# Patient Record
Sex: Male | Born: 1975 | Race: White | Hispanic: No | Marital: Married | Smoking: Current every day smoker
Health system: Southern US, Community
[De-identification: ages and names within clinical notes are randomized; demographics above are authoritative.]

## PROBLEM LIST (undated history)

## (undated) DIAGNOSIS — Z789 Other specified health status: Secondary | ICD-10-CM

## (undated) HISTORY — PX: KNEE ARTHROSCOPY: SHX127

---

## 1998-10-25 ENCOUNTER — Emergency Department (HOSPITAL_COMMUNITY): Admission: EM | Admit: 1998-10-25 | Discharge: 1998-10-25 | Payer: Self-pay | Admitting: Emergency Medicine

## 1999-11-05 ENCOUNTER — Encounter: Payer: Self-pay | Admitting: Emergency Medicine

## 1999-11-05 ENCOUNTER — Emergency Department (HOSPITAL_COMMUNITY): Admission: EM | Admit: 1999-11-05 | Discharge: 1999-11-05 | Payer: Self-pay | Admitting: Emergency Medicine

## 2000-01-25 ENCOUNTER — Emergency Department (HOSPITAL_COMMUNITY): Admission: EM | Admit: 2000-01-25 | Discharge: 2000-01-26 | Payer: Self-pay | Admitting: Emergency Medicine

## 2000-01-25 ENCOUNTER — Encounter: Payer: Self-pay | Admitting: Emergency Medicine

## 2001-05-24 ENCOUNTER — Emergency Department (HOSPITAL_COMMUNITY): Admission: EM | Admit: 2001-05-24 | Discharge: 2001-05-24 | Payer: Self-pay | Admitting: Emergency Medicine

## 2002-03-25 ENCOUNTER — Encounter: Payer: Self-pay | Admitting: Emergency Medicine

## 2002-03-25 ENCOUNTER — Emergency Department (HOSPITAL_COMMUNITY): Admission: EM | Admit: 2002-03-25 | Discharge: 2002-03-25 | Payer: Self-pay | Admitting: Emergency Medicine

## 2003-06-13 ENCOUNTER — Emergency Department (HOSPITAL_COMMUNITY): Admission: AD | Admit: 2003-06-13 | Discharge: 2003-06-13 | Payer: Self-pay | Admitting: *Deleted

## 2005-10-23 ENCOUNTER — Emergency Department (HOSPITAL_COMMUNITY): Admission: EM | Admit: 2005-10-23 | Discharge: 2005-10-23 | Payer: Self-pay | Admitting: Family Medicine

## 2006-04-11 ENCOUNTER — Emergency Department (HOSPITAL_COMMUNITY): Admission: EM | Admit: 2006-04-11 | Discharge: 2006-04-11 | Payer: Self-pay | Admitting: Emergency Medicine

## 2015-05-20 ENCOUNTER — Encounter (HOSPITAL_COMMUNITY)
Admission: EM | Disposition: A | Discharge status: Home / Self Care | Payer: Self-pay | Source: Home / Self Care | Attending: Emergency Medicine

## 2015-05-20 ENCOUNTER — Emergency Department (HOSPITAL_COMMUNITY): Payer: 59

## 2015-05-20 ENCOUNTER — Observation Stay (HOSPITAL_COMMUNITY)
Admission: EM | Admit: 2015-05-20 | Discharge: 2015-05-21 | Disposition: A | Discharge status: Home / Self Care | Payer: 59 | Attending: General Surgery | Admitting: General Surgery

## 2015-05-20 ENCOUNTER — Encounter (HOSPITAL_COMMUNITY): Payer: Self-pay | Admitting: Emergency Medicine

## 2015-05-20 ENCOUNTER — Emergency Department (HOSPITAL_COMMUNITY): Payer: 59 | Admitting: Certified Registered Nurse Anesthetist

## 2015-05-20 DIAGNOSIS — K3580 Unspecified acute appendicitis: Secondary | ICD-10-CM | POA: Diagnosis not present

## 2015-05-20 DIAGNOSIS — R1031 Right lower quadrant pain: Secondary | ICD-10-CM | POA: Diagnosis present

## 2015-05-20 DIAGNOSIS — F17210 Nicotine dependence, cigarettes, uncomplicated: Secondary | ICD-10-CM | POA: Insufficient documentation

## 2015-05-20 DIAGNOSIS — K353 Acute appendicitis with localized peritonitis, without perforation or gangrene: Secondary | ICD-10-CM

## 2015-05-20 HISTORY — DX: Other specified health status: Z78.9

## 2015-05-20 HISTORY — PX: LAPAROSCOPIC APPENDECTOMY: SHX408

## 2015-05-20 HISTORY — PX: APPENDECTOMY: SHX54

## 2015-05-20 LAB — CBC
HEMATOCRIT: 47.1 % (ref 39.0–52.0)
Hemoglobin: 16.1 g/dL (ref 13.0–17.0)
MCH: 32.3 pg (ref 26.0–34.0)
MCHC: 34.2 g/dL (ref 30.0–36.0)
MCV: 94.4 fL (ref 78.0–100.0)
Platelets: 206 10*3/uL (ref 150–400)
RBC: 4.99 MIL/uL (ref 4.22–5.81)
RDW: 13.3 % (ref 11.5–15.5)
WBC: 16.5 10*3/uL — AB (ref 4.0–10.5)

## 2015-05-20 LAB — URINALYSIS, ROUTINE W REFLEX MICROSCOPIC
Bilirubin Urine: NEGATIVE
GLUCOSE, UA: NEGATIVE mg/dL
HGB URINE DIPSTICK: NEGATIVE
KETONES UR: NEGATIVE mg/dL
Nitrite: NEGATIVE
PH: 7 (ref 5.0–8.0)
Protein, ur: NEGATIVE mg/dL
Specific Gravity, Urine: 1.024 (ref 1.005–1.030)

## 2015-05-20 LAB — COMPREHENSIVE METABOLIC PANEL
ALBUMIN: 3.9 g/dL (ref 3.5–5.0)
ALT: 24 U/L (ref 17–63)
AST: 23 U/L (ref 15–41)
Alkaline Phosphatase: 66 U/L (ref 38–126)
Anion gap: 9 (ref 5–15)
BUN: 7 mg/dL (ref 6–20)
CHLORIDE: 104 mmol/L (ref 101–111)
CO2: 26 mmol/L (ref 22–32)
Calcium: 9.1 mg/dL (ref 8.9–10.3)
Creatinine, Ser: 0.97 mg/dL (ref 0.61–1.24)
GFR calc Af Amer: >60 mL/min (ref >60)
GLUCOSE: 140 mg/dL — AB (ref 65–99)
POTASSIUM: 3.9 mmol/L (ref 3.5–5.1)
SODIUM: 139 mmol/L (ref 135–145)
Total Bilirubin: 0.7 mg/dL (ref 0.3–1.2)
Total Protein: 7.3 g/dL (ref 6.5–8.1)

## 2015-05-20 LAB — URINE MICROSCOPIC-ADD ON

## 2015-05-20 LAB — LIPASE, BLOOD: LIPASE: 22 U/L (ref 11–51)

## 2015-05-20 SURGERY — APPENDECTOMY, LAPAROSCOPIC
Anesthesia: General

## 2015-05-20 MED ORDER — ONDANSETRON HCL 4 MG/2ML IJ SOLN
INTRAMUSCULAR | Status: DC | PRN
  Administered 2015-05-20: 4 mg via INTRAVENOUS

## 2015-05-20 MED ORDER — IOHEXOL 300 MG/ML  SOLN
100.0000 mL | Freq: Once | INTRAMUSCULAR | Status: AC | PRN
  Administered 2015-05-20: 100 mL via INTRAVENOUS

## 2015-05-20 MED ORDER — HYDROMORPHONE HCL 1 MG/ML IJ SOLN
0.5000 mg | INTRAMUSCULAR | Status: DC | PRN
  Administered 2015-05-20 – 2015-05-21 (×3): 0.5 mg via INTRAVENOUS
  Filled 2015-05-20 (×3): qty 1

## 2015-05-20 MED ORDER — MIDAZOLAM HCL 2 MG/2ML IJ SOLN
INTRAMUSCULAR | Status: AC
  Filled 2015-05-20: qty 2

## 2015-05-20 MED ORDER — DIPHENHYDRAMINE HCL 50 MG/ML IJ SOLN: 12.5000 mg | Freq: Four times a day (QID) | INTRAMUSCULAR | Status: DC | PRN

## 2015-05-20 MED ORDER — FENTANYL CITRATE (PF) 100 MCG/2ML IJ SOLN
INTRAMUSCULAR | Status: AC
  Administered 2015-05-20: 50 ug via INTRAVENOUS
  Filled 2015-05-20: qty 2

## 2015-05-20 MED ORDER — ONDANSETRON HCL 4 MG/2ML IJ SOLN
4.0000 mg | Freq: Once | INTRAMUSCULAR | Status: AC
  Administered 2015-05-20: 4 mg via INTRAVENOUS
  Filled 2015-05-20: qty 2

## 2015-05-20 MED ORDER — DIPHENHYDRAMINE HCL 12.5 MG/5ML PO ELIX: 12.5000 mg | ORAL_SOLUTION | Freq: Four times a day (QID) | ORAL | Status: DC | PRN

## 2015-05-20 MED ORDER — OXYCODONE-ACETAMINOPHEN 5-325 MG PO TABS
ORAL_TABLET | ORAL | Status: AC
  Filled 2015-05-20: qty 2

## 2015-05-20 MED ORDER — BUPIVACAINE-EPINEPHRINE (PF) 0.25% -1:200000 IJ SOLN
INTRAMUSCULAR | Status: AC
  Filled 2015-05-20: qty 30

## 2015-05-20 MED ORDER — LACTATED RINGERS IV SOLN
INTRAVENOUS | Status: DC | PRN
  Administered 2015-05-20 (×2): via INTRAVENOUS

## 2015-05-20 MED ORDER — 0.9 % SODIUM CHLORIDE (POUR BTL) OPTIME
TOPICAL | Status: DC | PRN
Start: 2015-05-20 — End: 2015-05-20
  Administered 2015-05-20: 1000 mL

## 2015-05-20 MED ORDER — ROCURONIUM BROMIDE 100 MG/10ML IV SOLN
INTRAVENOUS | Status: DC | PRN
  Administered 2015-05-20: 50 mg via INTRAVENOUS

## 2015-05-20 MED ORDER — PROPOFOL 10 MG/ML IV BOLUS
INTRAVENOUS | Status: AC
  Filled 2015-05-20: qty 40

## 2015-05-20 MED ORDER — SODIUM CHLORIDE 0.9 % IR SOLN
Status: DC | PRN
  Administered 2015-05-20: 1000 mL

## 2015-05-20 MED ORDER — BUPIVACAINE-EPINEPHRINE 0.25% -1:200000 IJ SOLN
INTRAMUSCULAR | Status: DC | PRN
  Administered 2015-05-20: 10 mL

## 2015-05-20 MED ORDER — MEPERIDINE HCL 25 MG/ML IJ SOLN: 6.2500 mg | INTRAMUSCULAR | Status: DC | PRN

## 2015-05-20 MED ORDER — FENTANYL CITRATE (PF) 250 MCG/5ML IJ SOLN
INTRAMUSCULAR | Status: AC
  Filled 2015-05-20: qty 5

## 2015-05-20 MED ORDER — ROCURONIUM BROMIDE 50 MG/5ML IV SOLN
INTRAVENOUS | Status: AC
  Filled 2015-05-20: qty 1

## 2015-05-20 MED ORDER — FENTANYL CITRATE (PF) 100 MCG/2ML IJ SOLN
INTRAMUSCULAR | Status: AC
  Filled 2015-05-20: qty 2

## 2015-05-20 MED ORDER — DEXAMETHASONE SODIUM PHOSPHATE 4 MG/ML IJ SOLN
INTRAMUSCULAR | Status: DC | PRN
  Administered 2015-05-20: 4 mg via INTRAVENOUS

## 2015-05-20 MED ORDER — ENOXAPARIN SODIUM 40 MG/0.4ML ~~LOC~~ SOLN
40.0000 mg | SUBCUTANEOUS | Status: DC
  Administered 2015-05-20: 40 mg via SUBCUTANEOUS
  Filled 2015-05-20: qty 0.4

## 2015-05-20 MED ORDER — MIDAZOLAM HCL 5 MG/5ML IJ SOLN
INTRAMUSCULAR | Status: DC | PRN
  Administered 2015-05-20: 2 mg via INTRAVENOUS

## 2015-05-20 MED ORDER — SODIUM CHLORIDE 0.9 % IV BOLUS (SEPSIS)
1000.0000 mL | Freq: Once | INTRAVENOUS | Status: AC
  Administered 2015-05-20: 1000 mL via INTRAVENOUS

## 2015-05-20 MED ORDER — LACTATED RINGERS IV SOLN: INTRAVENOUS | Status: DC

## 2015-05-20 MED ORDER — PROPOFOL 10 MG/ML IV BOLUS
INTRAVENOUS | Status: DC | PRN
  Administered 2015-05-20: 200 mg via INTRAVENOUS

## 2015-05-20 MED ORDER — METRONIDAZOLE IN NACL 5-0.79 MG/ML-% IV SOLN
500.0000 mg | Freq: Three times a day (TID) | INTRAVENOUS | Status: DC
  Administered 2015-05-20 – 2015-05-21 (×3): 500 mg via INTRAVENOUS
  Filled 2015-05-20 (×5): qty 100

## 2015-05-20 MED ORDER — FENTANYL CITRATE (PF) 100 MCG/2ML IJ SOLN
INTRAMUSCULAR | Status: DC | PRN
  Administered 2015-05-20: 100 ug via INTRAVENOUS
  Administered 2015-05-20 (×3): 50 ug via INTRAVENOUS

## 2015-05-20 MED ORDER — ARTIFICIAL TEARS OP OINT
TOPICAL_OINTMENT | OPHTHALMIC | Status: AC
  Filled 2015-05-20: qty 3.5

## 2015-05-20 MED ORDER — SUGAMMADEX SODIUM 200 MG/2ML IV SOLN
INTRAVENOUS | Status: DC | PRN
  Administered 2015-05-20: 200 mg via INTRAVENOUS

## 2015-05-20 MED ORDER — ACETAMINOPHEN 325 MG PO TABS: 650.0000 mg | ORAL_TABLET | Freq: Four times a day (QID) | ORAL | Status: DC | PRN

## 2015-05-20 MED ORDER — ONDANSETRON HCL 4 MG/2ML IJ SOLN
INTRAMUSCULAR | Status: AC
  Filled 2015-05-20: qty 2

## 2015-05-20 MED ORDER — FENTANYL CITRATE (PF) 100 MCG/2ML IJ SOLN
25.0000 ug | INTRAMUSCULAR | Status: DC | PRN
  Administered 2015-05-20 (×3): 50 ug via INTRAVENOUS

## 2015-05-20 MED ORDER — LACTATED RINGERS IV SOLN
INTRAVENOUS | Status: DC
  Administered 2015-05-20: 16:00:00 via INTRAVENOUS

## 2015-05-20 MED ORDER — PROMETHAZINE HCL 25 MG/ML IJ SOLN: 6.2500 mg | INTRAMUSCULAR | Status: DC | PRN

## 2015-05-20 MED ORDER — ONDANSETRON HCL 4 MG/2ML IJ SOLN: 4.0000 mg | Freq: Four times a day (QID) | INTRAMUSCULAR | Status: DC | PRN

## 2015-05-20 MED ORDER — ACETAMINOPHEN 650 MG RE SUPP: 650.0000 mg | Freq: Four times a day (QID) | RECTAL | Status: DC | PRN

## 2015-05-20 MED ORDER — SUGAMMADEX SODIUM 200 MG/2ML IV SOLN
INTRAVENOUS | Status: AC
  Filled 2015-05-20: qty 2

## 2015-05-20 MED ORDER — DEXTROSE 5 % IV SOLN
2.0000 g | INTRAVENOUS | Status: DC
  Administered 2015-05-20: 2 g via INTRAVENOUS
  Filled 2015-05-20 (×2): qty 2

## 2015-05-20 MED ORDER — OXYCODONE-ACETAMINOPHEN 5-325 MG PO TABS
1.0000 | ORAL_TABLET | ORAL | Status: DC | PRN
  Administered 2015-05-20: 2 via ORAL
  Administered 2015-05-21: 1 via ORAL
  Administered 2015-05-21: 2 via ORAL
  Filled 2015-05-20: qty 2
  Filled 2015-05-20: qty 1

## 2015-05-20 MED ORDER — SODIUM CHLORIDE 0.9 % IV SOLN
INTRAVENOUS | Status: DC
  Administered 2015-05-20 – 2015-05-21 (×3): via INTRAVENOUS

## 2015-05-20 SURGICAL SUPPLY — 41 items
APPLIER CLIP ROT 10 11.4 M/L (STAPLE)
CANISTER SUCTION 2500CC (MISCELLANEOUS) ×3 IMPLANT
CHLORAPREP W/TINT 26ML (MISCELLANEOUS) ×3 IMPLANT
CLIP APPLIE ROT 10 11.4 M/L (STAPLE) IMPLANT
CLOSURE WOUND 1/2 X4 (GAUZE/BANDAGES/DRESSINGS) ×1
COVER SURGICAL LIGHT HANDLE (MISCELLANEOUS) ×3 IMPLANT
CUTTER FLEX LINEAR 45M (STAPLE) ×3 IMPLANT
DEVICE TROCAR PUNCTURE CLOSURE (ENDOMECHANICALS) ×3 IMPLANT
DRAPE LAPAROSCOPIC ABDOMINAL (DRAPES) ×3 IMPLANT
ELECT REM PT RETURN 9FT ADLT (ELECTROSURGICAL) ×3
ELECTRODE REM PT RTRN 9FT ADLT (ELECTROSURGICAL) ×1 IMPLANT
GLOVE BIO SURGEON STRL SZ7 (GLOVE) ×6 IMPLANT
GLOVE BIO SURGEON STRL SZ7.5 (GLOVE) ×3 IMPLANT
GLOVE BIOGEL PI IND STRL 7.5 (GLOVE) ×2 IMPLANT
GLOVE BIOGEL PI INDICATOR 7.5 (GLOVE) ×4
GLOVE SURG SS PI 7.0 STRL IVOR (GLOVE) ×3 IMPLANT
GOWN STRL REUS W/ TWL LRG LVL3 (GOWN DISPOSABLE) ×3 IMPLANT
GOWN STRL REUS W/TWL LRG LVL3 (GOWN DISPOSABLE) ×6
KIT BASIN OR (CUSTOM PROCEDURE TRAY) ×3 IMPLANT
KIT ROOM TURNOVER OR (KITS) ×3 IMPLANT
LIQUID BAND (GAUZE/BANDAGES/DRESSINGS) ×3 IMPLANT
NS IRRIG 1000ML POUR BTL (IV SOLUTION) ×3 IMPLANT
PAD ARMBOARD 7.5X6 YLW CONV (MISCELLANEOUS) ×6 IMPLANT
POUCH RETRIEVAL ECOSAC 10 (ENDOMECHANICALS) ×1 IMPLANT
POUCH RETRIEVAL ECOSAC 10MM (ENDOMECHANICALS) ×2
RELOAD STAPLE TA45 3.5 REG BLU (ENDOMECHANICALS) ×3 IMPLANT
SCALPEL HARMONIC ACE (MISCELLANEOUS) ×3 IMPLANT
SCISSORS LAP 5X35 DISP (ENDOMECHANICALS) IMPLANT
SET IRRIG TUBING LAPAROSCOPIC (IRRIGATION / IRRIGATOR) ×3 IMPLANT
SLEEVE ENDOPATH XCEL 5M (ENDOMECHANICALS) ×3 IMPLANT
SPECIMEN JAR SMALL (MISCELLANEOUS) ×3 IMPLANT
STRIP CLOSURE SKIN 1/2X4 (GAUZE/BANDAGES/DRESSINGS) ×2 IMPLANT
SUT MNCRL AB 4-0 PS2 18 (SUTURE) ×3 IMPLANT
SUT VICRYL 0 UR6 27IN ABS (SUTURE) ×3 IMPLANT
TOWEL OR 17X24 6PK STRL BLUE (TOWEL DISPOSABLE) ×3 IMPLANT
TOWEL OR 17X26 10 PK STRL BLUE (TOWEL DISPOSABLE) ×3 IMPLANT
TRAY FOLEY CATH 16FR SILVER (SET/KITS/TRAYS/PACK) ×3 IMPLANT
TRAY LAPAROSCOPIC MC (CUSTOM PROCEDURE TRAY) ×3 IMPLANT
TROCAR XCEL BLUNT TIP 100MML (ENDOMECHANICALS) ×3 IMPLANT
TROCAR XCEL NON-BLD 5MMX100MML (ENDOMECHANICALS) ×3 IMPLANT
TUBING INSUFFLATION (TUBING) ×3 IMPLANT

## 2015-05-20 NOTE — H&P (Signed)
Chief Complaint: RLQ abdominal pain  HPI: Darryl Ortega is 40 year old healthy male who presents with RLQ abdominal pain which started around 0930 while he was working.  Associated with vomiting x1 episode.  Denies fever, chills, sweats, anorexia, diarrhea or constipation.  Aggravated by movement.  No alleviating factors.  Coarse is improved with pain meds.  Work up shows WBC 16.5k, normal electrolytes, renal function and UA. CT of abdomen and pelvis demonstrated acute appendicitis. We have therefore been asked to evaluate.   History reviewed. No pertinent past medical history.  History reviewed. No pertinent past surgical history.  Family History  Problem Relation Age of Onset  . CAD Mother   . Diabetes Mellitus II Mother   . Hypertension Mother   . Hyperlipidemia Mother   . Appendicitis Brother    Social History:  reports that he has been smoking.  He does not have any smokeless tobacco history on file. He reports that he does not drink alcohol or use illicit drugs.  Allergies: No Known Allergies   (Not in a hospital admission)  Results for orders placed or performed during the hospital encounter of 05/20/15 (from the past 48 hour(s))  Lipase, blood     Status: None   Collection Time: 05/20/15 10:57 AM  Result Value Ref Range   Lipase 22 11 - 51 U/L  Comprehensive metabolic panel     Status: Abnormal   Collection Time: 05/20/15 10:57 AM  Result Value Ref Range   Sodium 139 135 - 145 mmol/L   Potassium 3.9 3.5 - 5.1 mmol/L   Chloride 104 101 - 111 mmol/L   CO2 26 22 - 32 mmol/L   Glucose, Bld 140 (H) 65 - 99 mg/dL   BUN 7 6 - 20 mg/dL   Creatinine, Ser 0.97 0.61 - 1.24 mg/dL   Calcium 9.1 8.9 - 10.3 mg/dL   Total Protein 7.3 6.5 - 8.1 g/dL   Albumin 3.9 3.5 - 5.0 g/dL   AST 23 15 - 41 U/L   ALT 24 17 - 63 U/L   Alkaline Phosphatase 66 38 - 126 U/L   Total Bilirubin 0.7 0.3 - 1.2 mg/dL   GFR calc non Af Amer >60 >60 mL/min   GFR calc Af Amer >60 >60 mL/min     Comment: (NOTE) The eGFR has been calculated using the CKD EPI equation. This calculation has not been validated in all clinical situations. eGFR's persistently <60 mL/min signify possible Chronic Kidney Disease.    Anion gap 9 5 - 15  CBC     Status: Abnormal   Collection Time: 05/20/15 10:57 AM  Result Value Ref Range   WBC 16.5 (H) 4.0 - 10.5 K/uL   RBC 4.99 4.22 - 5.81 MIL/uL   Hemoglobin 16.1 13.0 - 17.0 g/dL   HCT 47.1 39.0 - 52.0 %   MCV 94.4 78.0 - 100.0 fL   MCH 32.3 26.0 - 34.0 pg   MCHC 34.2 30.0 - 36.0 g/dL   RDW 13.3 11.5 - 15.5 %   Platelets 206 150 - 400 K/uL  Urinalysis, Routine w reflex microscopic (not at [facility])     Status: Abnormal   Collection Time: 05/20/15 11:16 AM  Result Value Ref Range   Color, Urine YELLOW YELLOW   APPearance CLOUDY (A) CLEAR   Specific Gravity, Urine 1.024 1.005 - 1.030   pH 7.0 5.0 - 8.0   Glucose, UA NEGATIVE NEGATIVE mg/dL   Hgb urine dipstick NEGATIVE NEGATIVE  Bilirubin Urine NEGATIVE NEGATIVE   Ketones, ur NEGATIVE NEGATIVE mg/dL   Protein, ur NEGATIVE NEGATIVE mg/dL   Nitrite NEGATIVE NEGATIVE   Leukocytes, UA TRACE (A) NEGATIVE  Urine microscopic-add on     Status: Abnormal   Collection Time: 05/20/15 11:16 AM  Result Value Ref Range   Squamous Epithelial / LPF 0-5 (A) NONE SEEN   WBC, UA 0-5 0 - 5 WBC/hpf   RBC / HPF 0-5 0 - 5 RBC/hpf   Bacteria, UA RARE (A) NONE SEEN   Urine-Other MUCOUS PRESENT    Ct Abdomen Pelvis W Contrast  05/20/2015  CLINICAL DATA:  Right lower quadrant abdominal pain. Single episode of vomiting. Elevated white blood cell count. EXAM: CT ABDOMEN AND PELVIS WITH CONTRAST TECHNIQUE: Multidetector CT imaging of the abdomen and pelvis was performed using the standard protocol following bolus administration of intravenous contrast. CONTRAST:  179m OMNIPAQUE IOHEXOL 300 MG/ML  SOLN COMPARISON:  None. FINDINGS: Lower chest:  No acute findings. Hepatobiliary: No masses or other significant abnormality.  Pancreas: No mass, inflammatory changes, or other significant abnormality. Spleen: Within normal limits in size and appearance. Adrenals/Urinary Tract: No masses identified. No evidence of hydronephrosis. Stomach/Bowel: No evidence of obstruction, inflammatory process, or abnormal fluid collections. Scattered colonic diverticulosis without evidence of diverticulitis. The appendix is fluid-filled and thickened to 11 mm, consistent with acute appendicitis. There is associated mild hyper enhancement of the appendiceal wall, and periappendiceal fat stranding without evidence of focal fluid collection. Vascular/Lymphatic: No pathologically enlarged lymph nodes. No evidence of abdominal aortic aneurysm. Reproductive: No mass or other significant abnormality. Other: None. Musculoskeletal: 9 mm sclerotic lesion within the right posterior acetabulum, and a smaller less defined sclerotic lesion within the right ilium. IMPRESSION: Acute appendicitis without evidence of rupture. Two less than a cm sclerotic lesions within the right acetabulum and right ilium. In the absence of history of malignancy capable of producing sclerotic lesions these likely represent bone islands. Electronically Signed   By: DFidela SalisburyM.D.   On: 05/20/2015 13:54    Review of Systems  Constitutional: Positive for malaise/fatigue. Negative for fever, chills, weight loss and diaphoresis.  Eyes: Negative for blurred vision, double vision, photophobia, pain, discharge and redness.  Respiratory: Negative for cough, hemoptysis, sputum production, shortness of breath and wheezing.   Cardiovascular: Negative for chest pain, palpitations, orthopnea, claudication, leg swelling and PND.  Gastrointestinal: Positive for vomiting and abdominal pain. Negative for heartburn, diarrhea, constipation, blood in stool and melena.  Genitourinary: Negative for dysuria, urgency, frequency, hematuria and flank pain.  Musculoskeletal: Negative for myalgias,  back pain, joint pain, falls and neck pain.  Neurological: Negative for dizziness, tingling, tremors, sensory change, speech change, focal weakness, seizures, loss of consciousness and headaches.  Psychiatric/Behavioral: Negative for substance abuse.    Blood pressure 145/91, pulse 83, temperature 97.6 F (36.4 C), temperature source Oral, resp. rate 18, SpO2 100 %. Physical Exam  Constitutional: He is oriented to person, place, and time. He appears well-developed and well-nourished. No distress.  HENT:  Head: Normocephalic and atraumatic.  Mouth/Throat: No oropharyngeal exudate.  Neck: Normal range of motion. Neck supple.  Cardiovascular: Normal rate, regular rhythm, normal heart sounds and intact distal pulses.  Exam reveals no gallop and no friction rub.   No murmur heard. Respiratory: Effort normal and breath sounds normal. No respiratory distress. He has no wheezes. He has no rales. He exhibits no tenderness.  GI: Soft. Bowel sounds are normal. He exhibits no mass. There is no guarding.  Focally tender to RLQ without rebound tenderness.   Musculoskeletal: Normal range of motion. He exhibits no edema or tenderness.  Neurological: He is alert and oriented to person, place, and time.  Skin: Skin is warm. He is not diaphoretic.  Psychiatric: He has a normal mood and affect. His behavior is normal. Judgment and thought content normal.     Assessment/Plan Acute appendicitis-to OR for laparoscopic appendectomy.  Surgical risks including infection, bleeding, injury to surrounding structures requiring surgical repair, open surgery, heart attack, DVT/PE, respiratory compromise.  The patient verbalizes understanding and wishes to proceed.   ID-rocephin/flagyl FEN-IVF, NPO VTE prophylaxis-SCD, post op lovenox   Erby Pian, NP  Pager [phone] 05/20/2015, 2:44 PM

## 2015-05-20 NOTE — Anesthesia Preprocedure Evaluation (Signed)
Anesthesia Evaluation  Patient identified by MRN, date of birth, ID band Patient awake    Reviewed: Allergy & Precautions, NPO status , Patient's Chart, lab work & pertinent test results  Airway Mallampati: II  TM Distance: >3 FB Neck ROM: Full    Dental no notable dental hx. (+) Chipped   Pulmonary neg pulmonary ROS, Current Smoker,    Pulmonary exam normal breath sounds clear to auscultation       Cardiovascular negative cardio ROS Normal cardiovascular exam Rhythm:Regular Rate:Normal     Neuro/Psych negative neurological ROS  negative psych ROS   GI/Hepatic negative GI ROS, Neg liver ROS,   Endo/Other  negative endocrine ROS  Renal/GU negative Renal ROS  negative genitourinary   Musculoskeletal negative musculoskeletal ROS (+)   Abdominal   Peds negative pediatric ROS (+)  Hematology negative hematology ROS (+)   Anesthesia Other Findings   Reproductive/Obstetrics negative OB ROS                             Anesthesia Physical Anesthesia Plan  ASA: II and emergent  Anesthesia Plan: General   Post-op Pain Management:    Induction: Intravenous  Airway Management Planned: Oral ETT  Additional Equipment:   Intra-op Plan:   Post-operative Plan: Extubation in OR  Informed Consent: I have reviewed the patients History and Physical, chart, labs and discussed the procedure including the risks, benefits and alternatives for the proposed anesthesia with the patient or authorized representative who has indicated his/her understanding and acceptance.   Dental advisory given  Plan Discussed with: CRNA  Anesthesia Plan Comments:         Anesthesia Quick Evaluation

## 2015-05-20 NOTE — ED Notes (Signed)
Patient complains of sudden sharp RLQ abdominal pain this am.  No rebound tenderness.  Patient alert and oriented and in no apparent distress at this time.

## 2015-05-20 NOTE — Transfer of Care (Signed)
Immediate Anesthesia Transfer of Care Note  Patient: Darryl Ortega  Procedure(s) Performed: Procedure(s): APPENDECTOMY LAPAROSCOPIC (N/A)  Patient Location: PACU  Anesthesia Type:General  Level of Consciousness: awake, alert  and oriented  Airway & Oxygen Therapy: Patient Spontanous Breathing and Patient connected to nasal cannula oxygen  Post-op Assessment: Report given to RN and Post -op Vital signs reviewed and stable  Post vital signs: Reviewed and stable  Last Vitals:  Filed Vitals:   05/20/15 1700 05/20/15 1701  BP:  135/80  Pulse: 95 95  Temp:    Resp: 17 17    Complications: No apparent anesthesia complications

## 2015-05-20 NOTE — Op Note (Signed)
Preoperative diagnosis: acute suppurative appendicitis Postoperative diagnosis: same as above Procedure: laparoscopic appendectomy Surgeon: Dr Harden Mo Anesthesia: general EBL: minimal Drains none Specimen appendix to pathology Complications: none Sponge count correct at completion Disposition to recovery stable  Indications: This is a 32 yom with rlq pain,elevated wbc and ct with appendicitis. We discussed laparoscopic appendectomy.   Procedure: After informed consent was obtained the patient was taken to the operating room. He was already given antibiotics. Sequential compression devices were on his legs.He was placed under general anesthesia without complication. His abdomen was prepped and draped in the standard sterile surgical fashion. A surgical timeout was then performed. A foley catheter was placed.   I infiltrated marcaine below the umbilicus. I made an incision and then entered the fascia sharply. I then entered the peritoneum bluntly. I placed a 0 vicryl pursestring suture and inserted a hasson trocar.I then inserted 2 further 5 mm trocars in the suprapubic region and the left mid abdomen. He had acute suppurative appendicitis that was not perforated. I saw the TI into the right colon. I then dissected it free from the cecum. I divided the mesoappendix with the harmonic scalpel. The appendiceal artery was divided. I then divided the appendix with the gia stapler. I then placed this in a bag and removed it from the abdomen.  I then obtained hemostasis and irrigated. I then removed the umbilical trocar and closed with 0 vicryl and the endoclose device after tying down the pursestring.  I then desufflated the abdomen and removed all my remaining trocars. I then closed these with 4-0 Monocryl and Dermabond. He tolerated this well was extubated and transferred to the recovery room in stable condition

## 2015-05-20 NOTE — ED Provider Notes (Signed)
CSN: [id number]     Arrival date & time 05/20/15  1013 History   First MD Initiated Contact with Patient 05/20/15 1035     Chief Complaint  Patient presents with  . Abdominal Pain     (Consider location/radiation/quality/duration/timing/severity/associated sxs/prior Treatment) HPI   Darryl Ortega Is a 40 year old male who presents the emergency room with chief complaint of right lower quadrant abdominal pain. The patient had sudden onset of my old, progressively worsening and now moderate to severe right lower quadrant abdominal pain since this morning. He has associated nausea and 1 episode of nonbloody, nonbilious vomitus. He denies any urinary symptoms, flank pain or history of kidney stones. He does heavy lifting, but his pain is not associated with any recent work and he denies pain radiating into the testicle or the inguinal region. Patient denies a history of previous abdominal surgeries or diverticulitis. He denies constipation, fevers, chills, myalgias or other signs of systemic infection. No other associated medical problems. He smokes one half pack of cigarettes daily.  History reviewed. No pertinent past medical history. History reviewed. No pertinent past surgical history. No family history on file. Social History  Substance Use Topics  . Smoking status: Current Every Day Smoker -- 0.50 packs/day  . Smokeless tobacco: None  . Alcohol Use: No    Review of Systems  Ten systems reviewed and are negative for acute change, except as noted in the HPI.    Allergies  Review of patient's allergies indicates no known allergies.  Home Medications   Prior to Admission medications   Not on File   BP 145/91 mmHg  Pulse 83  Temp(Src) 97.6 F (36.4 C) (Oral)  Resp 18  SpO2 100% Physical Exam  Constitutional: He appears well-developed and well-nourished. No distress.  Flushed appearance  HENT:  Head: Normocephalic and atraumatic.  Eyes: Conjunctivae are normal. No  scleral icterus.  Neck: Normal range of motion. Neck supple.  Cardiovascular: Normal rate, regular rhythm and normal heart sounds.   Pulmonary/Chest: Effort normal and breath sounds normal. No respiratory distress.  Abdominal: Soft. Bowel sounds are normal. He exhibits no distension. There is tenderness. There is guarding.  Patient is exquisitely tender to palpation in in the right lower quadrant of the abdomen. No inguinal tenderness. No CVA tenderness.  Musculoskeletal: He exhibits no edema.  Neurological: He is alert.  Skin: Skin is warm and dry. He is not diaphoretic.  Psychiatric: His behavior is normal.  Nursing note and vitals reviewed.   ED Course  Procedures (including critical care time) Labs Review Labs Reviewed  COMPREHENSIVE METABOLIC PANEL - Abnormal; Notable for the following:    Glucose, Bld 140 (*)    All other components within normal limits  CBC - Abnormal; Notable for the following:    WBC 16.5 (*)    All other components within normal limits  URINALYSIS, ROUTINE W REFLEX MICROSCOPIC (NOT AT [facility]) - Abnormal; Notable for the following:    APPearance CLOUDY (*)    Leukocytes, UA TRACE (*)    All other components within normal limits  URINE MICROSCOPIC-ADD ON - Abnormal; Notable for the following:    Squamous Epithelial / LPF 0-5 (*)    Bacteria, UA RARE (*)    All other components within normal limits  LIPASE, BLOOD    Imaging Review Ct Abdomen Pelvis W Contrast  05/20/2015  CLINICAL DATA:  Right lower quadrant abdominal pain. Single episode of vomiting. Elevated white blood cell count. EXAM: CT ABDOMEN AND PELVIS  WITH CONTRAST TECHNIQUE: Multidetector CT imaging of the abdomen and pelvis was performed using the standard protocol following bolus administration of intravenous contrast. CONTRAST:  OMNIPAQUE IOHEXOL 300 MG/ML  SOLN COMPARISON:  None. FINDINGS: Lower chest:  No acute findings. Hepatobiliary: No masses or other significant abnormality.  Pancreas: No mass, inflammatory changes, or other significant abnormality. Spleen: Within normal limits in size and appearance. Adrenals/Urinary Tract: No masses identified. No evidence of hydronephrosis. Stomach/Bowel: No evidence of obstruction, inflammatory process, or abnormal fluid collections. Scattered colonic diverticulosis without evidence of diverticulitis. The appendix is fluid-filled and thickened to 11 mm, consistent with acute appendicitis. There is associated mild hyper enhancement of the appendiceal wall, and periappendiceal fat stranding without evidence of focal fluid collection. Vascular/Lymphatic: No pathologically enlarged lymph nodes. No evidence of abdominal aortic aneurysm. Reproductive: No mass or other significant abnormality. Other: None. Musculoskeletal: 9 mm sclerotic lesion within the right posterior acetabulum, and a smaller less defined sclerotic lesion within the right ilium. IMPRESSION: Acute appendicitis without evidence of rupture. Two less than a cm sclerotic lesions within the right acetabulum and right ilium. In the absence of history of malignancy capable of producing sclerotic lesions these likely represent bone islands. Electronically Signed   By: Ted Mcalpine M.D.   On: 05/20/2015 13:54   I have personally reviewed and evaluated these images and lab results as part of my medical decision-making.   EKG Interpretation None      MDM   Final diagnoses:  Acute appendicitis with localized peritonitis   2:22 PM BP 145/91 mmHg  Pulse 83  Temp(Src) 97.6 F (36.4 C) (Oral)  Resp 18  SpO2 100%  Darryl Ortega presents with right lower quadrant abdominal pain. Patient has an elevated white blood cell count at 16.5 thousand. He is borderline tachycardic, hypertensive, without history of hypertension. He is exquisitely tender to palpation in the right lower quadrant. I have high suspicion for acute appendicitis. Differential also includes right-sided  diverticulitis. And, Yersinia. Patient will be given fluids, pain medication and CT scan of the abdomen.   2:22 PM BP 145/91 mmHg  Pulse 83  Temp(Src) 97.6 F (36.4 C) (Oral)  Resp 18  SpO2 100% Patient CT with confirmed acute appendicitis. Pain is well controlled at this time. I have placed a consult to general surgery. I have informed the patient of the diagnosis. He has been nothing by mouth since last night.  2:22 PM BP 145/91 mmHg  Pulse 83  Temp(Src) 97.6 F (36.4 C) (Oral)  Resp 18  SpO2 100% Patient will be taken admitted for surgery. Pt stable in ED with no significant deterioration in condition.   Arthor Captain, PA-C 05/20/15 1422  Marily Memos, MD 05/20/15 1425

## 2015-05-20 NOTE — Anesthesia Procedure Notes (Signed)
Procedure Name: Intubation Date/Time: 05/20/2015 3:57 PM Performed by: [place] Pink Pre-anesthesia Checklist: Patient identified, Emergency Drugs available, Suction available, Patient being monitored and Timeout performed Patient Re-evaluated:Patient Re-evaluated prior to inductionOxygen Delivery Method: Circle system utilized Preoxygenation: Pre-oxygenation with 100% oxygen Intubation Type: IV induction Ventilation: Mask ventilation without difficulty Laryngoscope Size: Mac and 4 Grade View: Grade I Tube type: Oral Tube size: 7.5 mm Number of attempts: 1 Airway Equipment and Method: Stylet and LTA kit utilized Placement Confirmation: ETT inserted through vocal cords under direct vision,  positive ETCO2 and breath sounds checked- equal and bilateral Secured at: 21 cm Tube secured with: Tape Dental Injury: Teeth and Oropharynx as per pre-operative assessment

## 2015-05-20 NOTE — Anesthesia Postprocedure Evaluation (Signed)
Anesthesia Post Note  Patient: Darryl Ortega  Procedure(s) Performed: Procedure(s) (LRB): APPENDECTOMY LAPAROSCOPIC (N/A)  Patient location during evaluation: PACU Anesthesia Type: General Level of consciousness: awake Pain management: pain level controlled Vital Signs Assessment: post-procedure vital signs reviewed and stable Respiratory status: spontaneous breathing Cardiovascular status: stable Anesthetic complications: no    Last Vitals:  Filed Vitals:   05/20/15 1753 05/20/15 1839  BP:  137/77  Pulse:  79  Temp: 37.2 C 37.4 C  Resp:  16    Last Pain:  Filed Vitals:   05/20/15 1841  PainSc: 6                  EDWARDS,Tekeisha Hakim

## 2015-05-20 NOTE — ED Notes (Signed)
Pt is aware urine is needed for testing, pt unable to urinate at this time. Urinal at bedside.  

## 2015-05-21 MED ORDER — OXYCODONE-ACETAMINOPHEN 5-325 MG PO TABS: 1.0000 | ORAL_TABLET | Freq: Four times a day (QID) | ORAL | Status: DC | PRN

## 2015-05-21 NOTE — Discharge Instructions (Signed)

## 2015-05-21 NOTE — Progress Notes (Signed)
Discharge home. Patient tolerated diet well. No complaints of severe pain , ambulated around the unit, no vomiting.

## 2015-05-21 NOTE — Discharge Summary (Signed)
[facility] Discharge Summary   Patient ID: Darryl Ortega MRN: [id number] DOB/AGE: 40-Jan-1977 40 y.o.  Admit date: 05/20/2015 Discharge date: 05/21/2015  Admitting Diagnosis: Acute appendicitis  Discharge Diagnosis Patient Active Problem List   Diagnosis Date Noted  . Acute appendicitis 05/20/2015    Consultants None  Imaging: Ct Abdomen Pelvis W Contrast  05/20/2015  CLINICAL DATA:  Right lower quadrant abdominal pain. Single episode of vomiting. Elevated white blood cell count. EXAM: CT ABDOMEN AND PELVIS WITH CONTRAST TECHNIQUE: Multidetector CT imaging of the abdomen and pelvis was performed using the standard protocol following bolus administration of intravenous contrast. CONTRAST:  OMNIPAQUE IOHEXOL 300 MG/ML  SOLN COMPARISON:  None. FINDINGS: Lower chest:  No acute findings. Hepatobiliary: No masses or other significant abnormality. Pancreas: No mass, inflammatory changes, or other significant abnormality. Spleen: Within normal limits in size and appearance. Adrenals/Urinary Tract: No masses identified. No evidence of hydronephrosis. Stomach/Bowel: No evidence of obstruction, inflammatory process, or abnormal fluid collections. Scattered colonic diverticulosis without evidence of diverticulitis. The appendix is fluid-filled and thickened to 11 mm, consistent with acute appendicitis. There is associated mild hyper enhancement of the appendiceal wall, and periappendiceal fat stranding without evidence of focal fluid collection. Vascular/Lymphatic: No pathologically enlarged lymph nodes. No evidence of abdominal aortic aneurysm. Reproductive: No mass or other significant abnormality. Other: None. Musculoskeletal: 9 mm sclerotic lesion within the right posterior acetabulum, and a smaller less defined sclerotic lesion within the right ilium. IMPRESSION: Acute appendicitis without evidence of rupture. Two less than a cm sclerotic lesions within the right acetabulum and  right ilium. In the absence of history of malignancy capable of producing sclerotic lesions these likely represent bone islands. Electronically Signed   By: Ted Mcalpine M.D.   On: 05/20/2015 13:54    Procedures Dr. Dwain Sarna (05/20/15) - Laparoscopic Appendectomy  Hospital Course:  Darryl Ortega is 40 year old healthy male who presents with RLQ abdominal pain which started around 0930 while he was working. Associated with vomiting x1 episode. Denies fever, chills, sweats, anorexia, diarrhea or constipation. Aggravated by movement. No alleviating factors. Coarse is improved with pain meds. Work up shows WBC 16.5k, normal electrolytes, renal function and UA.  CT of abdomen and pelvis demonstrated acute appendicitis.  Patient was admitted and underwent procedure listed above.  Tolerated procedure well and was transferred to the floor.  Diet was advanced as tolerated.  On POD #1, the patient was voiding well, tolerating diet, ambulating well, pain well controlled, vital signs stable, incisions c/d/i and felt stable for discharge home.  Patient will follow up in our office in 2-3 weeks and knows to call with questions or concerns.     Physical Exam: General:  Alert, NAD, pleasant, comfortable Abd:  Soft, ND, mild tenderness, incisions C/D/I with steri-strips and dermabond in place    Medication List    TAKE these medications        oxyCODONE-acetaminophen 5-325 MG tablet  Commonly known as:  PERCOCET/ROXICET  Take 1-2 tablets by mouth every 6 (six) hours as needed for moderate pain.         Follow-up Information    Follow up with [facility] Go on 05/31/2015.   Specialty:  General Surgery   Why:  For post-operation check. Your appointment is at 3:00pm, please arrive at least before your appointment to complete your check in paperwork.  If you are unable to arrive prior to your appointment time we may have to cancel  or reschedule you.   Contact  information:   [address] [place] [address] [phone]      Signed: Bradd Canary [facility] [phone]  05/21/2015, 8:03 AM

## 2015-05-23 ENCOUNTER — Encounter (HOSPITAL_COMMUNITY): Payer: Self-pay | Admitting: General Surgery

## 2016-08-24 ENCOUNTER — Ambulatory Visit: Payer: Self-pay

## 2016-08-24 ENCOUNTER — Other Ambulatory Visit: Payer: Self-pay | Admitting: Emergency Medicine

## 2016-08-24 ENCOUNTER — Ambulatory Visit (INDEPENDENT_AMBULATORY_CARE_PROVIDER_SITE_OTHER): Payer: Self-pay

## 2016-08-24 DIAGNOSIS — T1490XA Injury, unspecified, initial encounter: Secondary | ICD-10-CM

## 2016-08-24 DIAGNOSIS — M79621 Pain in right upper arm: Secondary | ICD-10-CM

## 2016-08-24 DIAGNOSIS — M25511 Pain in right shoulder: Secondary | ICD-10-CM

## 2016-08-25 ENCOUNTER — Emergency Department (HOSPITAL_COMMUNITY)
Admission: EM | Admit: 2016-08-25 | Discharge: 2016-08-25 | Disposition: A | Discharge status: Home / Self Care | Payer: 59 | Attending: Emergency Medicine | Admitting: Emergency Medicine

## 2016-08-25 ENCOUNTER — Emergency Department (HOSPITAL_COMMUNITY): Payer: 59

## 2016-08-25 ENCOUNTER — Encounter (HOSPITAL_COMMUNITY): Payer: Self-pay | Admitting: Emergency Medicine

## 2016-08-25 DIAGNOSIS — R55 Syncope and collapse: Secondary | ICD-10-CM | POA: Insufficient documentation

## 2016-08-25 DIAGNOSIS — F17200 Nicotine dependence, unspecified, uncomplicated: Secondary | ICD-10-CM | POA: Insufficient documentation

## 2016-08-25 LAB — COMPREHENSIVE METABOLIC PANEL
ALBUMIN: 4.2 g/dL (ref 3.5–5.0)
ALK PHOS: 65 U/L (ref 38–126)
ALT: 22 U/L (ref 17–63)
AST: 23 U/L (ref 15–41)
Anion gap: 10 (ref 5–15)
BILIRUBIN TOTAL: 0.8 mg/dL (ref 0.3–1.2)
BUN: 6 mg/dL (ref 6–20)
CO2: 21 mmol/L — ABNORMAL LOW (ref 22–32)
CREATININE: 0.79 mg/dL (ref 0.61–1.24)
Calcium: 9.3 mg/dL (ref 8.9–10.3)
Chloride: 107 mmol/L (ref 101–111)
GFR calc Af Amer: >60 mL/min (ref >60)
Glucose, Bld: 100 mg/dL — ABNORMAL HIGH (ref 65–99)
POTASSIUM: 3.5 mmol/L (ref 3.5–5.1)
Sodium: 138 mmol/L (ref 135–145)
TOTAL PROTEIN: 7.7 g/dL (ref 6.5–8.1)

## 2016-08-25 LAB — CBC WITH DIFFERENTIAL/PLATELET
BASOS ABS: 0 10*3/uL (ref 0.0–0.1)
Basophils Relative: 0 %
EOS PCT: 1 %
Eosinophils Absolute: 0.1 10*3/uL (ref 0.0–0.7)
HCT: 46.4 % (ref 39.0–52.0)
Hemoglobin: 15.7 g/dL (ref 13.0–17.0)
LYMPHS PCT: 21 %
Lymphs Abs: 3.4 10*3/uL (ref 0.7–4.0)
MCH: 32 pg (ref 26.0–34.0)
MCHC: 33.8 g/dL (ref 30.0–36.0)
MCV: 94.7 fL (ref 78.0–100.0)
Monocytes Absolute: 1 10*3/uL (ref 0.1–1.0)
Monocytes Relative: 6 %
Neutro Abs: 11.8 10*3/uL — ABNORMAL HIGH (ref 1.7–7.7)
Neutrophils Relative %: 72 %
PLATELETS: 268 10*3/uL (ref 150–400)
RBC: 4.9 MIL/uL (ref 4.22–5.81)
RDW: 13.2 % (ref 11.5–15.5)
WBC: 16.3 10*3/uL — ABNORMAL HIGH (ref 4.0–10.5)

## 2016-08-25 LAB — I-STAT CG4 LACTIC ACID, ED: Lactic Acid, Venous: 1.06 mmol/L (ref 0.5–1.9)

## 2016-08-25 NOTE — Discharge Instructions (Signed)
There was no clear cause for the episode of passing out. Make sure that you are getting plenty of rest, eating 3 meals each day and drinking plenty of fluids. Working in a hot environment can cause dehydration so make sure that you are keeping yourself well-hydrated.  Return here, if needed, for problems.

## 2016-08-25 NOTE — ED Triage Notes (Signed)
To ED, with c/o syncopal episode while in the bathroom, wife found pt on the floor, took approx 10 minutes to wake up-- no hx of seizures, no chest pain. On arrival -- pt is alert/oriented x 4 -- does not remember episode-

## 2016-08-25 NOTE — ED Provider Notes (Signed)
[facility]-EMERGENCY DEPT Provider Note   CSN: [id number] Arrival date & time: 08/25/16  1255     History   Chief Complaint Chief Complaint  Patient presents with  . Loss of Consciousness    HPI Darryl Ortega is a 41 y.o. male.  Patient presents for evaluation of an episode of syncope.  This occurred following urinating, while standing up, in his bathroom at home.  His daughter called to his wife, who arrived and found him unconscious.  She felt like his left hand was trembling, and he was groggy.  She believes he lost consciousness for about 2 minutes, then took about 10 minutes to fully gain his normal mental status and be able to walk.  No recent illnesses.  No new medications.  He saw a physician yesterday, regarding a right shoulder injury, and was placed in an arm sling.  Patient denies headache, chest pain, palpitations, prodrome before syncope or difficulty walking at this time.  No prior similar episode.  No history of seizures.  There are no other known modifying factors.  HPI  Past Medical History:  Diagnosis Date  . Medical history non-contributory     Patient Active Problem List   Diagnosis Date Noted  . Acute appendicitis 05/20/2015    Past Surgical History:  Procedure Laterality Date  . APPENDECTOMY  05/20/2015  . KNEE ARTHROSCOPY Right ~ 2009  . LAPAROSCOPIC APPENDECTOMY N/A 05/20/2015   Procedure: APPENDECTOMY LAPAROSCOPIC;  Surgeon: Emelia Loron, MD;  Location: [facility];  Service: General;  Laterality: N/A;       Home Medications    Prior to Admission medications   Not on File    Family History Family History  Problem Relation Age of Onset  . CAD Mother   . Diabetes Mellitus II Mother   . Hypertension Mother   . Hyperlipidemia Mother   . Appendicitis Brother     Social History Social History  Substance Use Topics  . Smoking status: Current Every Day Smoker    Packs/day: 1.00    Years: 17.00  . Smokeless tobacco: Never Used  . Alcohol  use No     Allergies   Patient has no known allergies.   Review of Systems Review of Systems  All other systems reviewed and are negative.    Physical Exam Updated Vital Signs BP (!) 157/97   Pulse 95   Temp (S) 99.5 F (37.5 C) (Oral)   Resp 13   Ht 5\' 9"  (1.753 m)   Wt 230 lb (104.3 kg)   SpO2 98%   BMI 33.97 kg/m    Physical Exam  Constitutional: He is oriented to person, place, and time. He appears well-developed and well-nourished. No distress.  HENT:  Head: Normocephalic and atraumatic.  Right Ear: External ear normal.  Left Ear: External ear normal.  No tongue abrasion  Eyes: Conjunctivae and EOM are normal. Pupils are equal, round, and reactive to light.  Neck: Normal range of motion and phonation normal. Neck supple.  Cardiovascular: Normal rate, regular rhythm and normal heart sounds.   Pulmonary/Chest: Effort normal and breath sounds normal. He exhibits no bony tenderness.  Abdominal: Soft. There is no tenderness.  Musculoskeletal: Normal range of motion.  Neurological: He is alert and oriented to person, place, and time. No cranial nerve deficit or sensory deficit. He exhibits normal muscle tone. Coordination normal.  No dysarthria, aphasia or nystagmus.  No pronator drift.  No ataxia.  Skin: Skin is warm, dry and intact.  Psychiatric:  He has a normal mood and affect. His behavior is normal. Judgment and thought content normal.  Nursing note and vitals reviewed.    ED Treatments / Results  Labs (all labs ordered are listed, but only abnormal results are displayed) Labs Reviewed  COMPREHENSIVE METABOLIC PANEL - Abnormal; Notable for the following:       Result Value   CO2 21 (*)    Glucose, Bld 100 (*)    All other components within normal limits  CBC WITH DIFFERENTIAL/PLATELET - Abnormal; Notable for the following:    WBC 16.3 (*)    Neutro Abs 11.8 (*)    All other components within normal limits  URINALYSIS, ROUTINE W REFLEX MICROSCOPIC    I-STAT CG4 LACTIC ACID, ED    EKG  EKG Interpretation  Date/Time:  Saturday Aug 25 2016 13:06:23 EDT Ventricular Rate:  117 PR Interval:  136 QRS Duration: 86 QT Interval:  318 QTC Calculation: 443 R Axis:   76 Text Interpretation:  Sinus tachycardia Otherwise normal ECG Since last tracing rate faster Confirmed by Mancel Bale [phone]) on 08/25/2016 2:35:49 PM       Radiology Dg Chest 2 View  Result Date: 08/25/2016 CLINICAL DATA:  Pt reports syncope post voiding while standing in the bathroom at home PTA. Pt c/o SOB, current smoker .5 ppd. Pt reports injury to right shoulder at work yesterday. EXAM: CHEST  2 VIEW COMPARISON:  04/11/2006 FINDINGS: The heart size and mediastinal contours are within normal limits. Both lungs are clear. No pleural effusion or pneumothorax. The visualized skeletal structures are unremarkable. IMPRESSION: No active cardiopulmonary disease. Electronically Signed   By: Amie Portland M.D.   On: 08/25/2016 13:52   Dg Shoulder Right  Result Date: 08/24/2016 CLINICAL DATA:  Pain after heavy lifting. EXAM: RIGHT SHOULDER - 2+ VIEW COMPARISON:  08/24/2016 . FINDINGS: Acromioclavicular and glenohumeral degenerative change. No evidence of fracture or dislocation. IMPRESSION: No acute abnormality. Electronically Signed   By: Maisie Fus  Register   On: 08/24/2016 14:23   Dg Humerus Right  Result Date: 08/24/2016 CLINICAL DATA:  Pain after heavy lifting. EXAM: RIGHT HUMERUS - 2+ VIEW COMPARISON:  No recent prior FINDINGS: No acute soft tissue bony abnormality identified. No evidence of fracture or dislocation. IMPRESSION: No acute abnormality. Electronically Signed   By: Maisie Fus  Register   On: 08/24/2016 14:25    Procedures Procedures (including critical care time)  Medications Ordered in ED Medications - No data to display   Initial Impression / Assessment and Plan / ED Course  I have reviewed the triage vital signs and the nursing notes.  Pertinent labs &  imaging results that were available during my care of the patient were reviewed by me and considered in my medical decision making (see chart for details).      Patient Vitals for the past 24 hrs:  BP Temp Temp src Pulse Resp SpO2 Height Weight  08/25/16 1500 (!) 157/97 - - 95 13 98 % - -  08/25/16 1406 (!) 159/103 - - (!) 101 15 99 % - -  08/25/16 1309 - - - - - - 5\' 9"  (1.753 m) 230 lb (104.3 kg)  08/25/16 1308 (!) 137/91 99.5 F (37.5 C) Oral (!) 120 16 98 % - -    3:27 PM Reevaluation with update and discussion. After initial assessment and treatment, an updated evaluation reveals no change in clinical status.  Findings discussed with patient and wife, all questions answered. Attikus Bartoszek L    Final  Clinical Impressions(s) / ED Diagnoses   Final diagnoses:  Syncope, unspecified syncope type    Episode of syncope, cause not clear.  Normal neurologic exam.  No sign of serious intracranial injury.  Metabolic instability.  No new medications.  Possible seizure, but no history of same.  Suspect vasovagal episode, possibly associated with micturition, versus dehydration.  Nursing Notes Reviewed/ Care Coordinated Applicable Imaging Reviewed Interpretation of Laboratory Data incorporated into ED treatment  The patient appears reasonably screened and/or stabilized for discharge and I doubt any other medical condition or other [facility] requiring further screening, evaluation, or treatment in the ED at this time prior to discharge.  Plan: Home Medications-APAP as needed pain; Home Treatments-rest, fluids; return here if the recommended treatment, does not improve the symptoms; Recommended follow up-PCP checkup 1 week and as needed.   New Prescriptions New Prescriptions   No medications on file    Mancel BaleWentz, Idalie Canto, MD 08/25/16 1529

## 2016-08-28 ENCOUNTER — Ambulatory Visit (INDEPENDENT_AMBULATORY_CARE_PROVIDER_SITE_OTHER): Payer: Self-pay

## 2016-08-28 ENCOUNTER — Other Ambulatory Visit: Payer: Self-pay | Admitting: Emergency Medicine

## 2016-08-28 DIAGNOSIS — T1490XA Injury, unspecified, initial encounter: Secondary | ICD-10-CM

## 2016-08-28 DIAGNOSIS — M25521 Pain in right elbow: Secondary | ICD-10-CM

## 2017-04-19 ENCOUNTER — Emergency Department (HOSPITAL_COMMUNITY): Payer: BLUE CROSS/BLUE SHIELD

## 2017-04-19 ENCOUNTER — Other Ambulatory Visit: Payer: Self-pay

## 2017-04-19 ENCOUNTER — Encounter (HOSPITAL_COMMUNITY): Payer: Self-pay | Admitting: Emergency Medicine

## 2017-04-19 DIAGNOSIS — Z5321 Procedure and treatment not carried out due to patient leaving prior to being seen by health care provider: Secondary | ICD-10-CM | POA: Diagnosis not present

## 2017-04-19 DIAGNOSIS — R079 Chest pain, unspecified: Secondary | ICD-10-CM | POA: Insufficient documentation

## 2017-04-19 LAB — CBC
HCT: 47.8 % (ref 39.0–52.0)
Hemoglobin: 16.2 g/dL (ref 13.0–17.0)
MCH: 32.1 pg (ref 26.0–34.0)
MCHC: 33.9 g/dL (ref 30.0–36.0)
MCV: 94.8 fL (ref 78.0–100.0)
Platelets: 237 10*3/uL (ref 150–400)
RBC: 5.04 MIL/uL (ref 4.22–5.81)
RDW: 13.6 % (ref 11.5–15.5)
WBC: 12.5 10*3/uL — AB (ref 4.0–10.5)

## 2017-04-19 LAB — BASIC METABOLIC PANEL
Anion gap: 9 (ref 5–15)
BUN: 9 mg/dL (ref 6–20)
CHLORIDE: 103 mmol/L (ref 101–111)
CO2: 24 mmol/L (ref 22–32)
Calcium: 9.2 mg/dL (ref 8.9–10.3)
Creatinine, Ser: 0.97 mg/dL (ref 0.61–1.24)
GFR calc Af Amer: >60 mL/min (ref >60)
GFR calc non Af Amer: >60 mL/min (ref >60)
GLUCOSE: 155 mg/dL — AB (ref 65–99)
Potassium: 3.6 mmol/L (ref 3.5–5.1)
Sodium: 136 mmol/L (ref 135–145)

## 2017-04-19 LAB — I-STAT TROPONIN, ED: Troponin i, poc: 0 ng/mL (ref 0.00–0.08)

## 2017-04-19 NOTE — ED Triage Notes (Signed)
Pt to ED with c/o left chest pain onset 2 hours ago while arguing with x-wife.  Pt denies any nausea, vomiting or shortness of breath

## 2017-04-20 ENCOUNTER — Emergency Department (HOSPITAL_COMMUNITY)
Admission: EM | Admit: 2017-04-20 | Discharge: 2017-04-20 | Discharge status: Against Medical Advice | Payer: BLUE CROSS/BLUE SHIELD | Attending: Emergency Medicine | Admitting: Emergency Medicine

## 2017-04-20 NOTE — ED Notes (Signed)
Called for Pt x'4 No Answer

## 2019-04-26 IMAGING — CR DG CHEST 2V
2 series · 2 of 2 positions shown · non-contrast
Comparison: 08/25/2016.

CLINICAL DATA: Chest pain for the past 3 hr.  Smoker.

EXAM:
CHEST  2 VIEW

[chest pa]
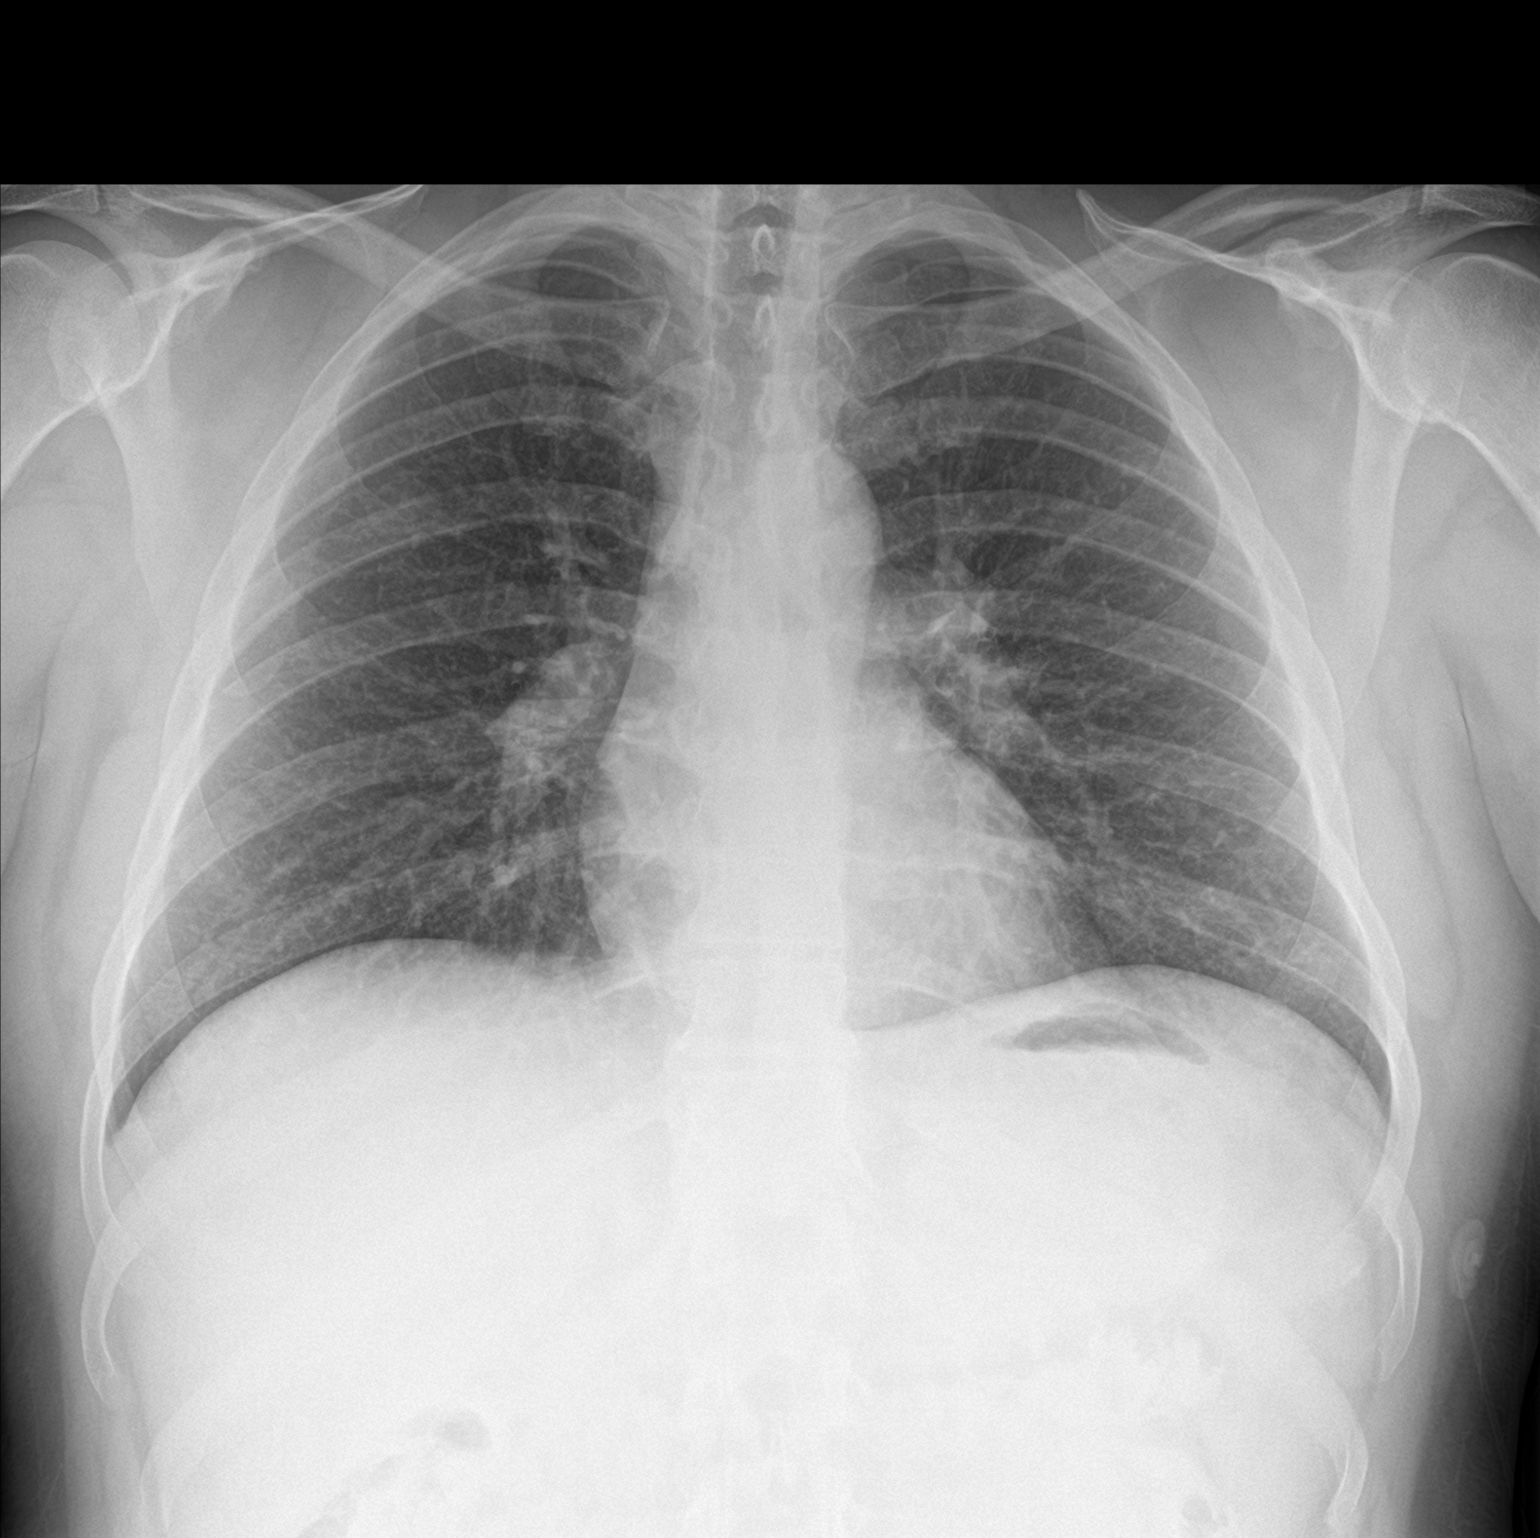

[chest lat]
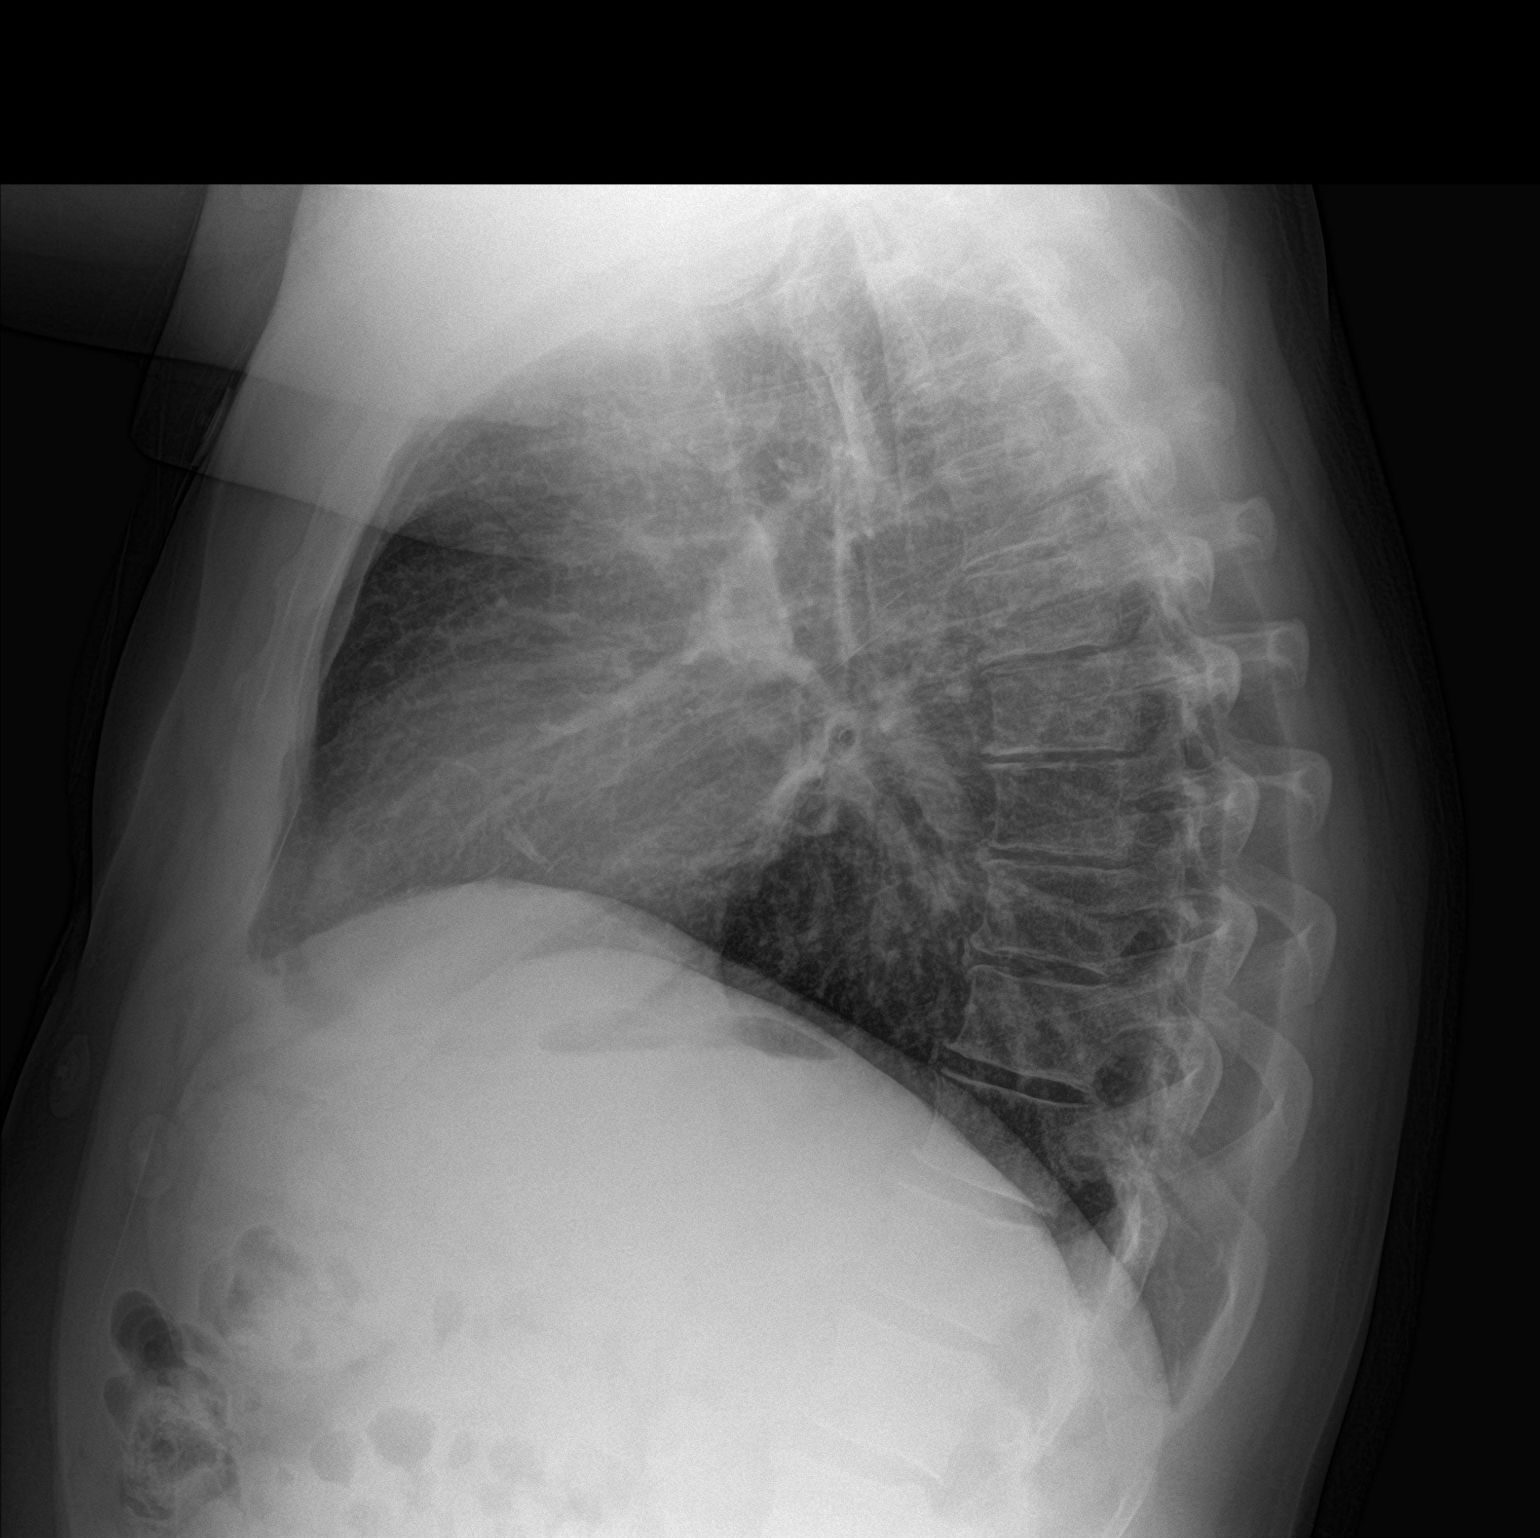

[2 of 2 positions shown; findings below may reference images not displayed]

FINDINGS: Normal sized heart. Clear lungs. Diffuse peribronchial thickening
and accentuation of the interstitial markings. Mild thoracic spine
degenerative changes.
IMPRESSION: No acute abnormality.  Stable chronic bronchitic changes.
